# Patient Record
Sex: Male | Born: 1995 | Race: Black or African American | Hispanic: No | Marital: Single | Smoking: Never smoker
Health system: Southern US, Community
[De-identification: ages and names within clinical notes are randomized; demographics above are authoritative.]

---

## 2021-02-04 ENCOUNTER — Encounter (HOSPITAL_COMMUNITY): Payer: Self-pay

## 2021-02-04 ENCOUNTER — Emergency Department (HOSPITAL_COMMUNITY): Payer: BC Managed Care – PPO

## 2021-02-04 ENCOUNTER — Other Ambulatory Visit: Payer: Self-pay

## 2021-02-04 ENCOUNTER — Emergency Department (HOSPITAL_COMMUNITY)
Admission: EM | Admit: 2021-02-04 | Discharge: 2021-02-04 | Disposition: A | Discharge status: Home / Self Care | Payer: BC Managed Care – PPO | Attending: Emergency Medicine | Admitting: Emergency Medicine

## 2021-02-04 DIAGNOSIS — S61411A Laceration without foreign body of right hand, initial encounter: Secondary | ICD-10-CM | POA: Diagnosis not present

## 2021-02-04 DIAGNOSIS — S61422A Laceration with foreign body of left hand, initial encounter: Secondary | ICD-10-CM

## 2021-02-04 DIAGNOSIS — S6991XA Unspecified injury of right wrist, hand and finger(s), initial encounter: Secondary | ICD-10-CM | POA: Diagnosis present

## 2021-02-04 DIAGNOSIS — Z23 Encounter for immunization: Secondary | ICD-10-CM | POA: Diagnosis not present

## 2021-02-04 DIAGNOSIS — S61421A Laceration with foreign body of right hand, initial encounter: Secondary | ICD-10-CM

## 2021-02-04 DIAGNOSIS — W228XXA Striking against or struck by other objects, initial encounter: Secondary | ICD-10-CM | POA: Insufficient documentation

## 2021-02-04 DIAGNOSIS — S61412A Laceration without foreign body of left hand, initial encounter: Secondary | ICD-10-CM | POA: Insufficient documentation

## 2021-02-04 MED ORDER — TETANUS-DIPHTH-ACELL PERTUSSIS 5-2.5-18.5 LF-MCG/0.5 IM SUSY
0.5000 mL | PREFILLED_SYRINGE | Freq: Once | INTRAMUSCULAR | Status: AC
  Administered 2021-02-04: 0.5 mL via INTRAMUSCULAR
  Filled 2021-02-04: qty 0.5

## 2021-02-04 MED ORDER — ACETAMINOPHEN 500 MG PO TABS
1000.0000 mg | ORAL_TABLET | Freq: Once | ORAL | Status: AC
  Administered 2021-02-04: 1000 mg via ORAL
  Filled 2021-02-04: qty 2

## 2021-02-04 MED ORDER — BACITRACIN ZINC 500 UNIT/GM EX OINT: TOPICAL_OINTMENT | Freq: Once | CUTANEOUS | Status: AC

## 2021-02-04 MED ORDER — LIDOCAINE HCL 2 % IJ SOLN
10.0000 mL | Freq: Once | INTRAMUSCULAR | Status: AC
  Administered 2021-02-04: 200 mg via INTRADERMAL
  Filled 2021-02-04: qty 20

## 2021-02-04 NOTE — ED Provider Notes (Signed)
[facility] EMERGENCY DEPARTMENT Provider Note   CSN: [id number] Arrival date & time: 02/04/21  4098     History Chief Complaint  Patient presents with  . Laceration    Nicolas Johnson is a 25 y.o. male who presents for evaluation of bilateral hand pain. He reports punching a window about 3-4 hours ago. Since then he has has had pain to both of his hands. He reports limits ROM secondary to pain. He also sustained several abrasions/lacerations. He denies any numbness. He does not know when his last tetanus shot was.   The history is provided by the patient.       History reviewed. No pertinent past medical history.  There are no problems to display for this patient.   History reviewed. No pertinent surgical history.     History reviewed. No pertinent family history.  Social History   Tobacco Use  . Smoking status: Never Smoker  . Smokeless tobacco: Never Used    Home Medications Prior to Admission medications   Not on File    Allergies    Patient has no allergy information on record.  Review of Systems   Review of Systems  Musculoskeletal:       Hand pain  Skin: Positive for wound.  All other systems reviewed and are negative.   Physical Exam Updated Vital Signs BP 121/75   Pulse 74   Temp 99 F (37.2 C) (Oral)   Resp 16   Ht  (1.753 m)   Wt 63.5 kg   SpO2 99%   BMI 20.67 kg/m   Physical Exam Vitals and nursing note reviewed.  Constitutional:      Appearance: He is well-developed and well-nourished.  HENT:     Head: Normocephalic and atraumatic.  Eyes:     General: No scleral icterus.       Right eye: No discharge.        Left eye: No discharge.     Extraocular Movements: EOM normal.     Conjunctiva/sclera: Conjunctivae normal.  Cardiovascular:     Pulses:          Radial pulses are 2+ on the right side and 2+ on the left side.  Pulmonary:     Effort: Pulmonary effort is normal.  Musculoskeletal:     Comments:  Diffuse tenderness noted to the dorsal aspect of the right hand.  Flexion/intention of 5 digits intact but with significant pain.  No bony tenderness noted to the wrist.  Tenderness palpation of the dorsal aspect of the left hand.  Flexion/tension of all 5 digits intact but with significant pain.  No bony tenderness noted to the wrist.  Skin:    General: Skin is warm and dry.     Capillary Refill: Capillary refill takes less than 2 seconds.     Comments: Scattered abrasions over the dorsal aspect of both the right and left hand.  On the right hand, he has a small 1.5 cm laceration at the dorsal aspect of the base of the fifth digit that extends into the webspace.  He has a 1.5 cm curvilinear laceration noted to the dorsal aspect of the right third digit overlying the PIP.  On the left hand, he has a 2 cm linear laceration approximately 2 mm proximal to the MCP of the third digit. Good distal cap refill. BUE is not dusky in appearance or cool to touch.  Neurological:     Mental Status: He is alert.  Comments: Sensation intact along major nerve distributions of BUE  Psychiatric:        Mood and Affect: Mood and affect normal.        Speech: Speech normal.        Behavior: Behavior normal.     ED Results / Procedures / Treatments   Labs (all labs ordered are listed, but only abnormal results are displayed) Labs Reviewed - No data to display  EKG None  Radiology DG Hand Complete Left  Result Date: 02/04/2021 CLINICAL DATA:  Left hand laceration EXAM: LEFT HAND - COMPLETE 3+ VIEW COMPARISON:  None. FINDINGS: Three view radiograph left hand demonstrates normal alignment. No fracture or dislocation. Joint spaces are preserved. Soft tissue swelling and small soft tissue defect is seen dorsal to the metacarpal heads. No retained radiopaque foreign body identified. IMPRESSION: Soft tissue swelling.  No fracture or dislocation. Electronically Signed   By: Helyn NumbersAshesh  Parikh MD   On: 02/04/2021 06:46    DG Hand Complete Right  Result Date: 02/04/2021 CLINICAL DATA:  [age] year old male punched a window.  Laceration. EXAM: RIGHT HAND - COMPLETE 3+ VIEW COMPARISON:  None. FINDINGS: Soft tissue gas between the 4th and 5th metacarpal heads, proximal phalanges. Superimposed punctate, faint radiodensities in the webspace between the proximal fingers there. Bone mineralization is within normal limits. Normal joint spaces and alignment. No osseous abnormality identified. IMPRESSION: 1. Soft tissue gas between the 4th and 5th metacarpal heads and suspicion of punctate retained glass fragments in the nearby webspace (arrows). 2. No osseous abnormality. Electronically Signed   By: Odessa FlemingH  Hall M.D.   On: 02/04/2021 06:45    Procedures .Marland Kitchen.Laceration Repair  Date/Time: 02/04/2021 7:53 AM Performed by: Maxwell CaulLayden, Lindsey A, PA-C Authorized by: Maxwell CaulLayden, Lindsey A, PA-C   Consent:    Consent obtained:  Verbal   Consent given by:  Patient   Risks discussed:  Infection, need for additional repair, pain, poor cosmetic result and poor wound healing   Alternatives discussed:  No treatment and delayed treatment Universal protocol:    Procedure explained and questions answered to patient or proxy's satisfaction: yes     Relevant documents present and verified: yes     Test results available: yes     Imaging studies available: yes     Required blood products, implants, devices, and special equipment available: yes     Site/side marked: yes     Immediately prior to procedure, a time out was called: yes     Patient identity confirmed:  Verbally with patient Anesthesia:    Anesthesia method:  Local infiltration   Local anesthetic:  Lidocaine 2% w/o epi Laceration details:    Location:  Hand   Hand location:  R hand, dorsum   Length (cm):  1.5 Pre-procedure details:    Preparation:  Patient was prepped and draped in usual sterile fashion Exploration:    Hemostasis achieved with:  Direct pressure   Imaging outcome:  foreign body noted     Wound extent: no foreign bodies/material noted   Treatment:    Area cleansed with:  Povidone-iodine and chlorhexidine   Amount of cleaning:  Extensive   Irrigation solution:  Sterile saline   Irrigation method:  Syringe   Visualized foreign bodies/material removed: yes     Debridement:  Minimal   Undermining:  Minimal   Scar revision: no   Skin repair:    Repair method:  Sutures   Suture size:  5-0   Suture material:  Nylon  Suture technique:  Simple interrupted   Number of sutures:  3 Approximation:    Approximation:  Close Repair type:    Repair type:  Simple Post-procedure details:    Dressing:  Antibiotic ointment and non-adherent dressing   Procedure completion:  Tolerated Comments:     Once the area was anesthetized and irrigated, it was thoroughly extensively cleaned out.  There was a small glass particle that was removed.  I do not see any other foreign bodies. Marland Kitchen.Laceration Repair  Date/Time: 02/04/2021 8:06 AM Performed by: Maxwell Caul, PA-C Authorized by: Maxwell Caul, PA-C   Consent:    Consent obtained:  Verbal   Consent given by:  Patient   Risks discussed:  Infection, need for additional repair, pain, poor cosmetic result and poor wound healing   Alternatives discussed:  No treatment and delayed treatment Universal protocol:    Procedure explained and questions answered to patient or proxy's satisfaction: yes     Relevant documents present and verified: yes     Test results available: yes     Imaging studies available: yes     Required blood products, implants, devices, and special equipment available: yes     Site/side marked: yes     Immediately prior to procedure, a time out was called: yes     Patient identity confirmed:  Verbally with patient Anesthesia:    Anesthesia method:  Local infiltration   Local anesthetic:  Lidocaine 2% w/o epi Laceration details:    Location:  Finger   Finger location:  L long finger    Length (cm):  1.5 Pre-procedure details:    Preparation:  Patient was prepped and draped in usual sterile fashion Exploration:    Hemostasis achieved with:  Direct pressure   Wound extent: no foreign bodies/material noted and no tendon damage noted   Treatment:    Area cleansed with:  Povidone-iodine   Amount of cleaning:  Extensive   Irrigation solution:  Sterile saline   Irrigation method:  Syringe   Visualized foreign bodies/material removed: no   Skin repair:    Repair method:  Sutures   Suture size:  5-0   Suture material:  Nylon   Suture technique:  Simple interrupted   Number of sutures:  4 Approximation:    Approximation:  Close Repair type:    Repair type:  Simple Post-procedure details:    Dressing:  Antibiotic ointment and non-adherent dressing   Procedure completion:  Tolerated Comments:     Once the area was anesthetized, it was thoroughly extensively irrigated. I do not see any foreign bodies. Using a finger tourniquet, I was able to fully examine the area. The tendon was visible but I did not see any disruption or injury to the tendon. He was able to fully flex and extend both the DIP and PIP when held in isolation. Marland Kitchen.Laceration Repair  Date/Time: 02/04/2021 8:25 AM Performed by: Maxwell Caul, PA-C Authorized by: Maxwell Caul, PA-C   Consent:    Consent obtained:  Verbal   Consent given by:  Patient   Risks discussed:  Infection, need for additional repair, pain, poor cosmetic result and poor wound healing   Alternatives discussed:  No treatment and delayed treatment Universal protocol:    Procedure explained and questions answered to patient or proxy's satisfaction: yes     Relevant documents present and verified: yes     Test results available: yes     Imaging studies available: yes  Required blood products, implants, devices, and special equipment available: yes     Site/side marked: yes     Immediately prior to procedure, a time out was  called: yes     Patient identity confirmed:  Verbally with patient Anesthesia:    Anesthesia method:  Local infiltration   Local anesthetic:  Lidocaine 2% w/o epi Laceration details:    Location:  Hand   Hand location:  L hand, dorsum Exploration:    Wound extent: no foreign bodies/material noted   Treatment:    Area cleansed with:  Povidone-iodine   Amount of cleaning:  Extensive   Irrigation solution:  Sterile saline   Irrigation method:  Syringe   Visualized foreign bodies/material removed: no   Skin repair:    Repair method:  Sutures   Suture size:  5-0   Suture material:  Nylon   Suture technique:  Simple interrupted and running locked   Number of sutures:  4 Approximation:    Approximation:  Close Repair type:    Repair type:  Simple Post-procedure details:    Dressing:  Antibiotic ointment   Procedure completion:  Tolerated Comments:     Once the area was anesthetized, was thoroughly extensively irrigated. Evaluation of the wound showed no evidence of foreign body. He had full flexion/tension of all 5 digits without any difficulty.     Medications Ordered in ED Medications  bacitracin ointment (has no administration in time range)  Tdap (BOOSTRIX) injection 0.5 mL (0.5 mLs Intramuscular Given 02/04/21 0654)  lidocaine (XYLOCAINE) 2 % (with pres) injection 200 mg (200 mg Intradermal Given 02/04/21 0731)  acetaminophen (TYLENOL) tablet 1,000 mg (1,000 mg Oral Given 02/04/21 3335)    ED Course  I have reviewed the triage vital signs and the nursing notes.  Pertinent labs & imaging results that were available during my care of the patient were reviewed by me and considered in my medical decision making (see chart for details).    MDM Rules/Calculators/A&P                          25 year old male who presents for evaluation of bilateral hand pain, lacerations that occurred early this morning. He reports that he got mad and punched a glass wall. He does not know when  his last tetanus shot was. On initial arrival, he is afebrile nontoxic-appearing. Vital signs are stable. He is neurovascularly intact. He has scattered abrasions noted dorsal aspect of bilateral hands. He has 3 lacerations. He has a small laceration to the fourth and fifth webspace on the right hand as well as a small laceration dorsal aspect of the right third digit. He also has an additional small laceration noted to the left dorsum him proximal to the MCP. Will obtain x-rays, update tetanus, wound care  XR right hand shows soft tissue gas between 4th and 5th metacarpal heads. No acute abnormalities. XR of left hand negative for any acute bony abnormality.   Lacerations repaired as documented above. Patient tolerated procedure well. Encouraged at home supportive care measures. At this time, patient exhibits no emergent life-threatening condition that require further evaluation in ED. Patient had ample opportunity for questions and discussion. All patient's questions were answered with full understanding. Strict return precautions discussed. Patient expresses understanding and agreement to plan.   Portions of this note were generated with Scientist, clinical (histocompatibility and immunogenetics). Dictation errors may occur despite best attempts at proofreading.   Final Clinical Impression(s) / ED Diagnoses  Final diagnoses:  Laceration of right hand with foreign body, initial encounter  Laceration of left hand with foreign body, initial encounter    Rx / DC Orders ED Discharge Orders    None       Rosana Hoes 02/04/21 0827    Alvira Monday, MD 02/04/21 [phone]

## 2021-02-04 NOTE — Discharge Instructions (Signed)
Keep the wound clean and dry for the first 24 hours. After that you may gently clean the wound with soap and water. Make sure to pat dry the wound before covering it with any dressing. You can use topical antibiotic ointment and bandage. Ice and elevate for pain relief.   You can take Tylenol or Ibuprofen as directed for pain. You can alternate Tylenol and Ibuprofen every 4 hours for additional pain relief.   Return to the Emergency Department, your primary care doctor, or the [facility] in 7 days for suture removal.   Monitor closely for any signs of infection. Return to the Emergency Department for any worsening redness/swelling of the area that begins to spread, drainage from the site, worsening pain, fever or any other worsening or concerning symptoms.    

## 2021-02-04 NOTE — ED Notes (Signed)
EDP at bedside suturing pt's hand.

## 2021-02-04 NOTE — ED Notes (Signed)
Patient transported to X-ray 

## 2021-02-04 NOTE — ED Triage Notes (Signed)
Pt BIB POV. Pt c/o laceration to both hand. Pt reports he punch a window using both hands.Bleeding controlled at this time.

## 2021-02-04 NOTE — ED Notes (Signed)
Pt was on the phone talking loudly during D/C instructions, as soon as he was given his papers he left without a full set of V/S being updated.

## 2022-09-13 IMAGING — DX DG HAND COMPLETE 3+V*R*
3 series · 3 of 3 positions shown · non-contrast
Comparison: None.

CLINICAL DATA: 25-year-old male punched a window.  Laceration.

EXAM:
RIGHT HAND - COMPLETE 3+ VIEW

[hand pa]
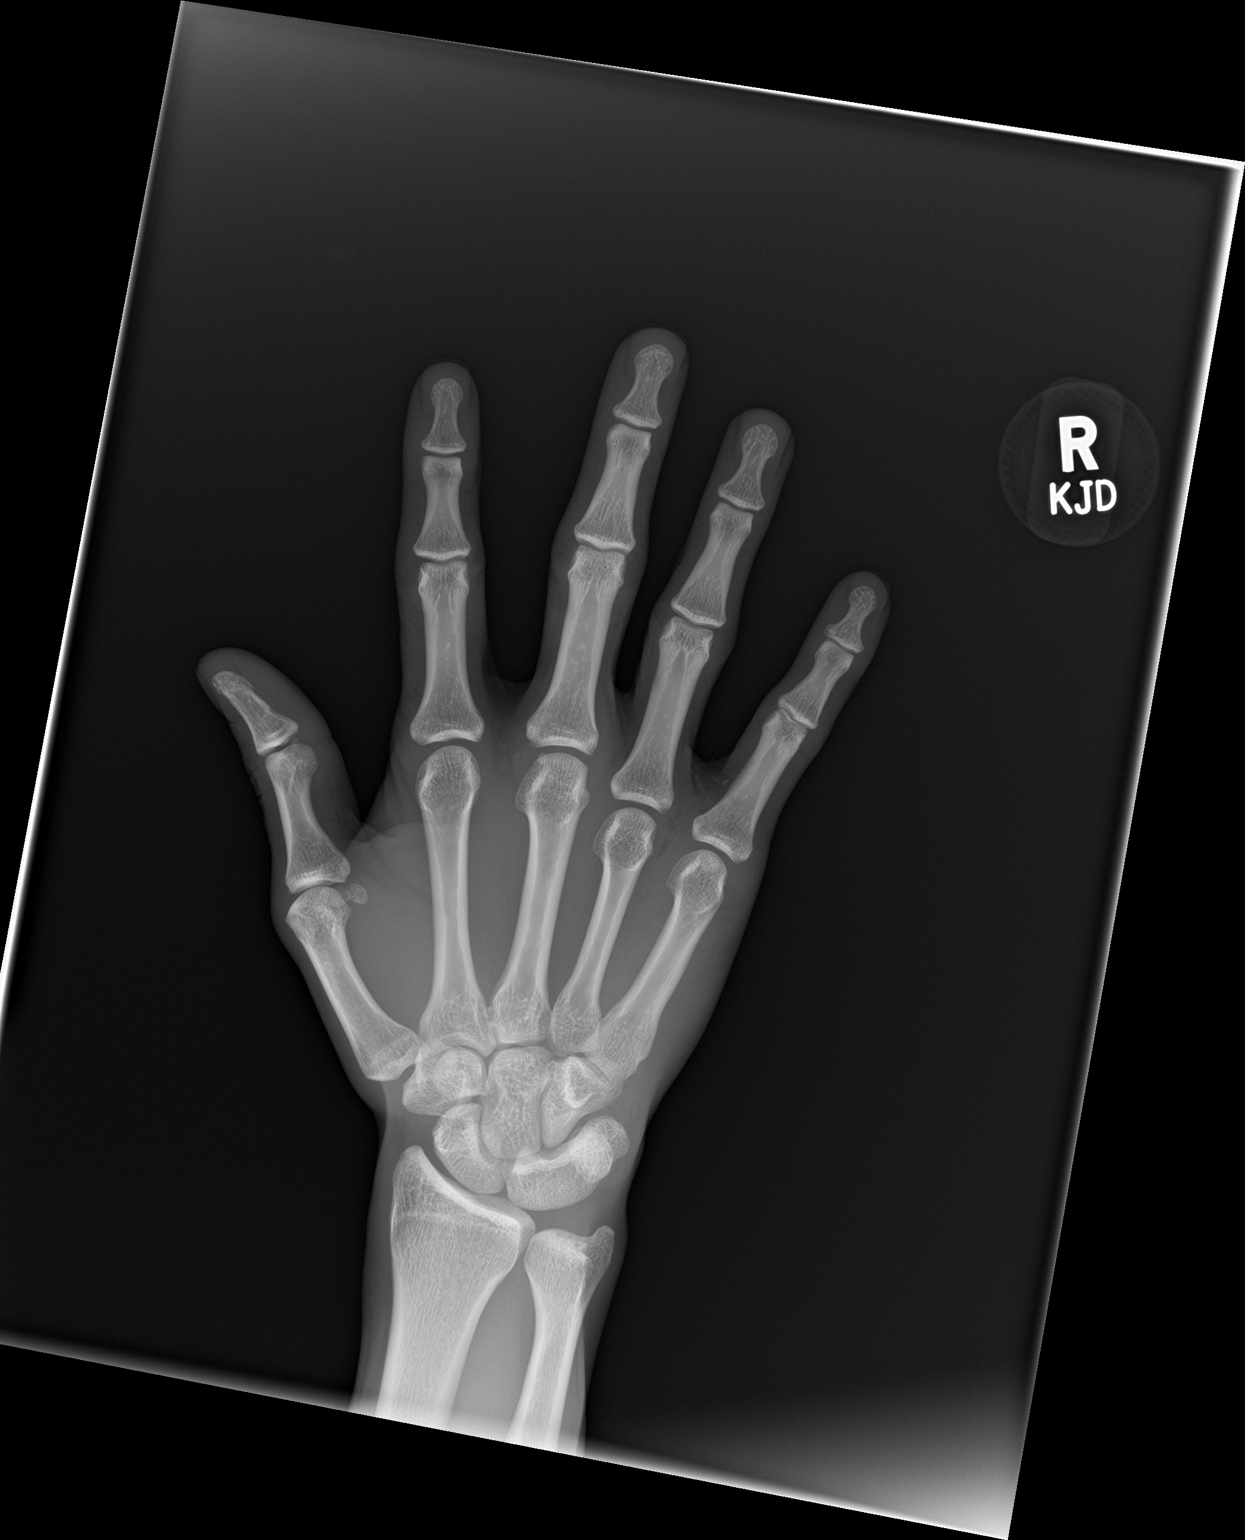

[hand obl]
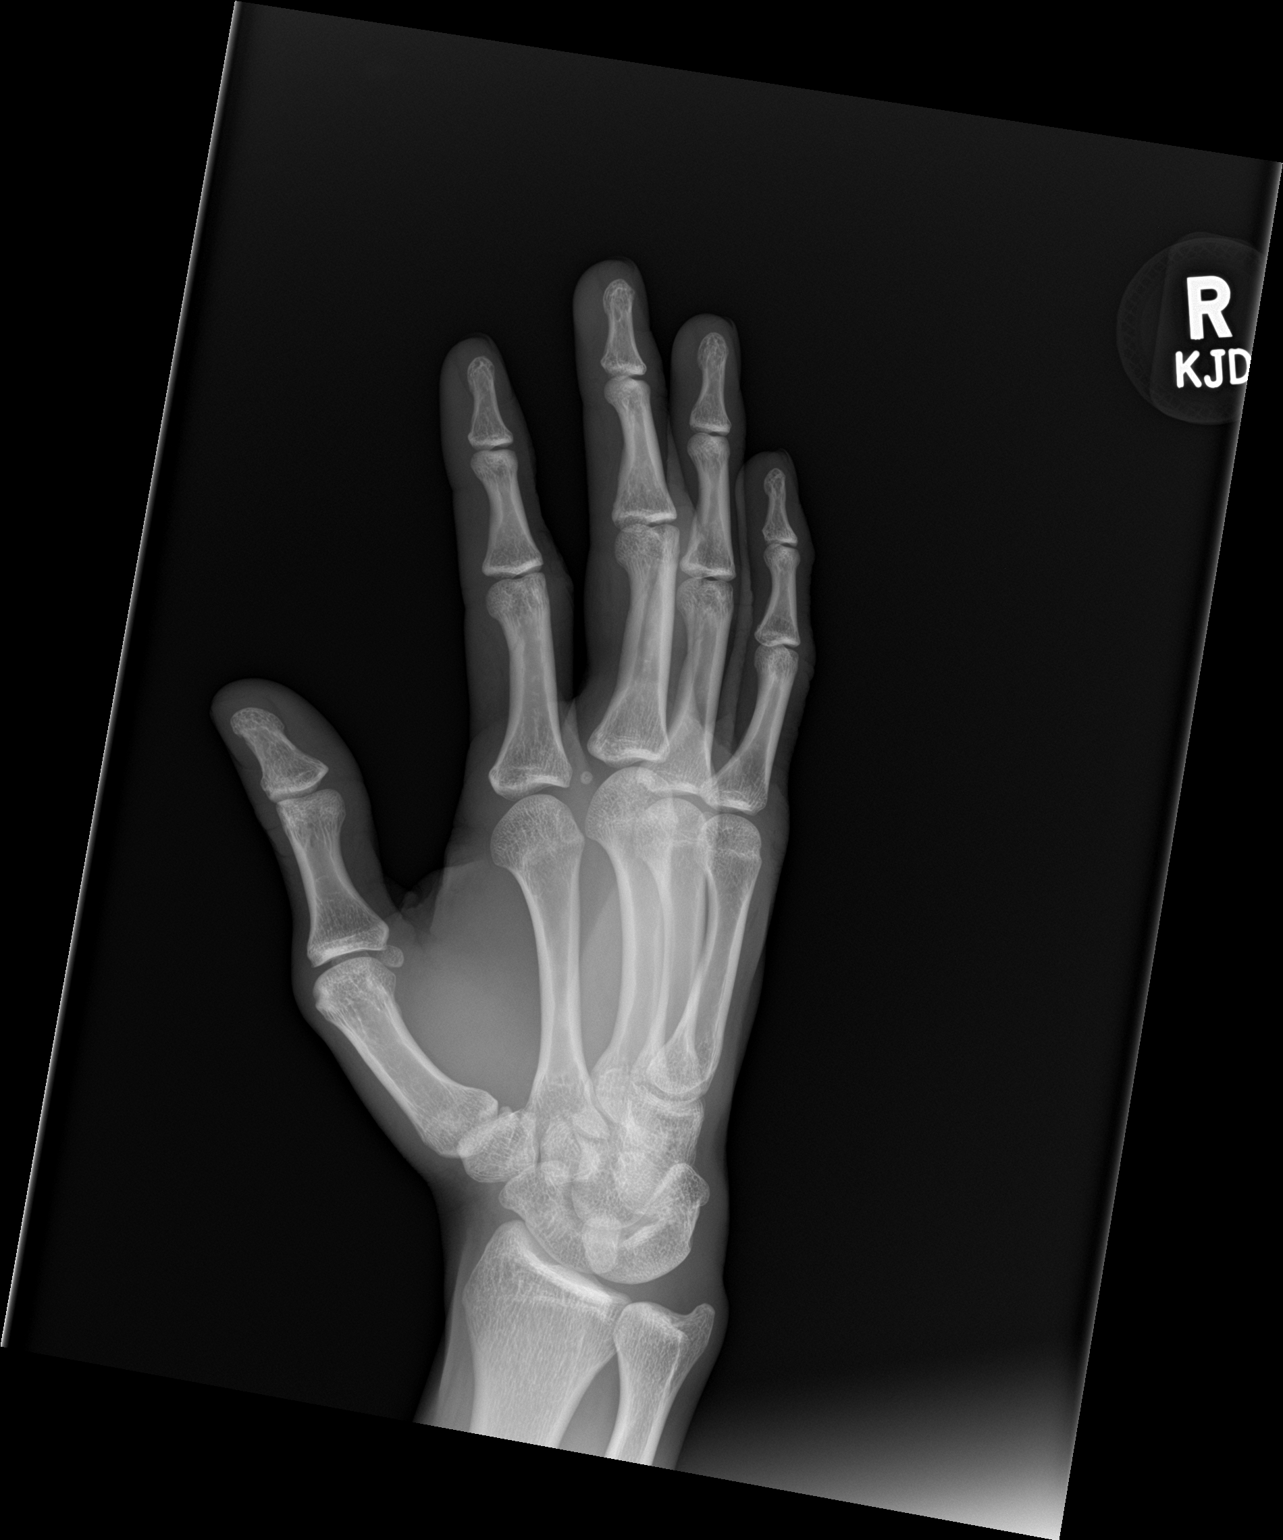

[hand lat]
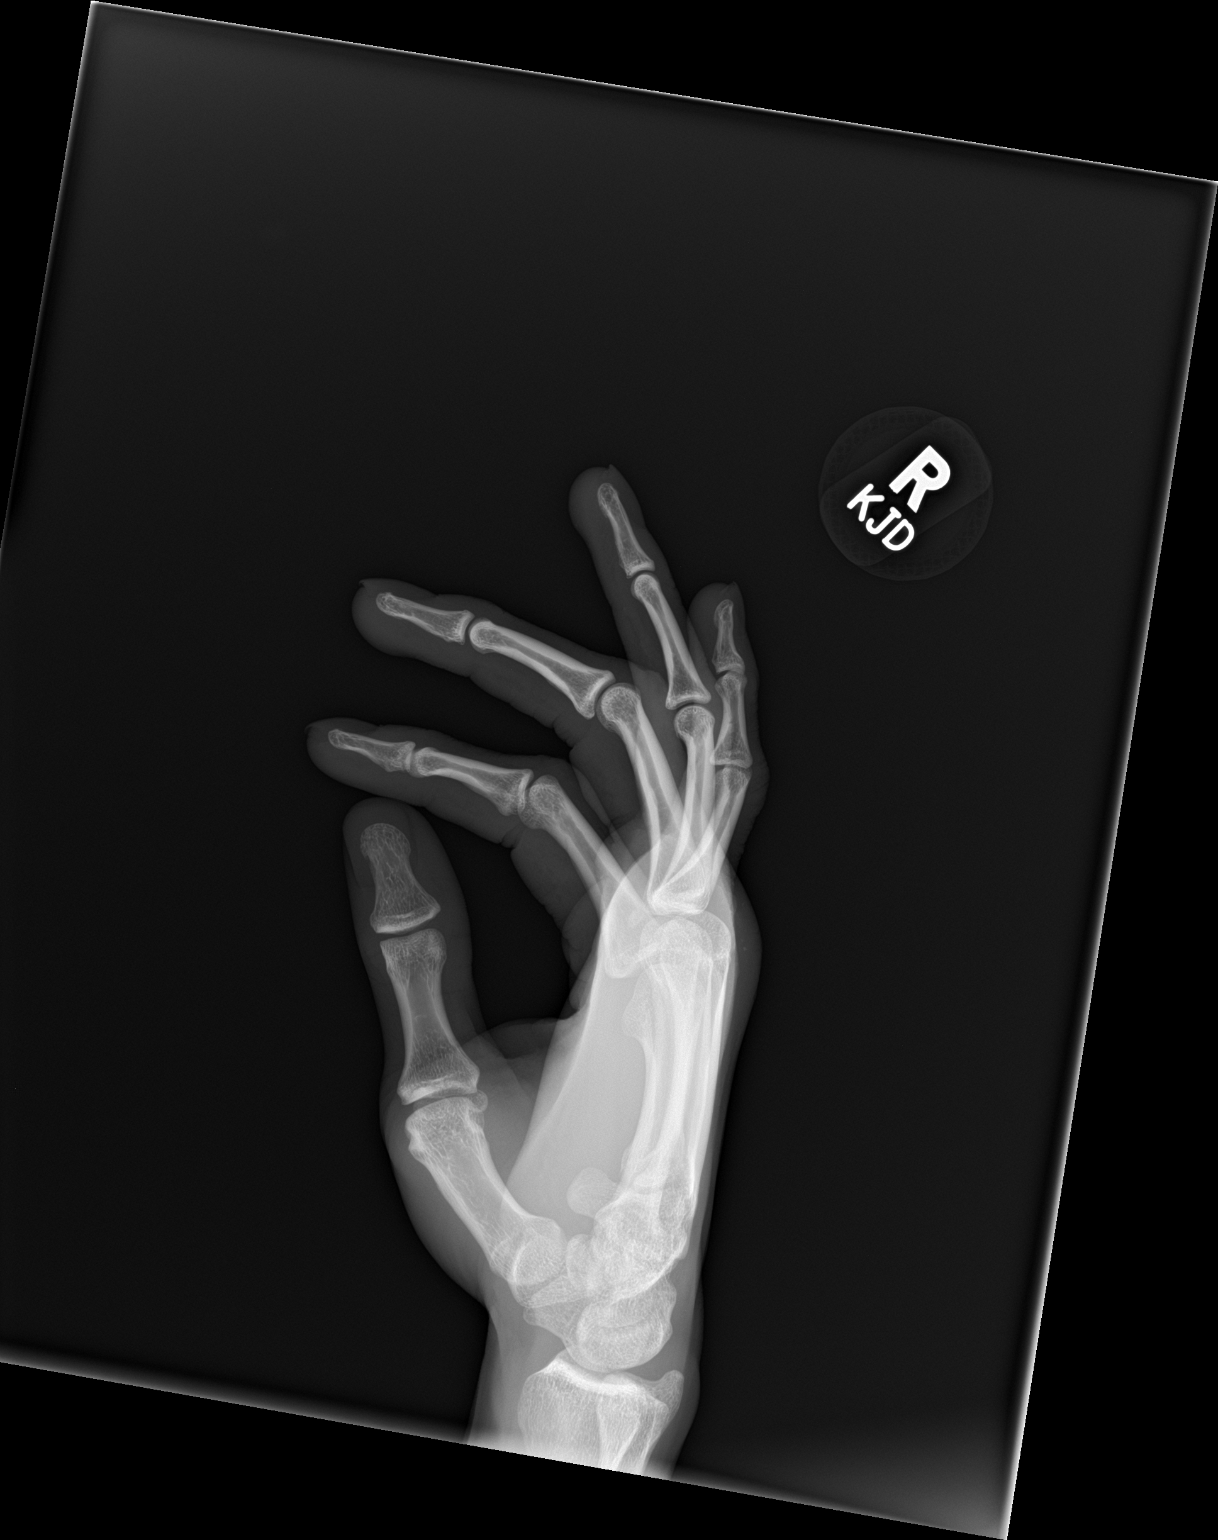

[3 of 3 positions shown; findings below may reference images not displayed]

FINDINGS: Soft tissue gas between the 4th and 5th metacarpal heads, proximal
phalanges. Superimposed punctate, faint radiodensities in the
webspace between the proximal fingers there.

Bone mineralization is within normal limits. Normal joint spaces and
alignment. No osseous abnormality identified.
IMPRESSION: 1. Soft tissue gas between the 4th and 5th metacarpal heads and
suspicion of punctate retained glass fragments in the nearby
webspace (arrows).
2. No osseous abnormality.
